# Patient Record
Sex: Female | Born: 1965 | Race: Black or African American | Hispanic: No | Smoking: Never smoker
Health system: Southern US, Community
[De-identification: ages and names within clinical notes are randomized; demographics above are authoritative.]

## PROBLEM LIST (undated history)

## (undated) DIAGNOSIS — E049 Nontoxic goiter, unspecified: Secondary | ICD-10-CM

## (undated) DIAGNOSIS — D352 Benign neoplasm of pituitary gland: Secondary | ICD-10-CM

## (undated) DIAGNOSIS — H9190 Unspecified hearing loss, unspecified ear: Secondary | ICD-10-CM

## (undated) DIAGNOSIS — D490 Neoplasm of unspecified behavior of digestive system: Secondary | ICD-10-CM

## (undated) DIAGNOSIS — C50919 Malignant neoplasm of unspecified site of unspecified female breast: Secondary | ICD-10-CM

## (undated) HISTORY — DX: Neoplasm of unspecified behavior of digestive system: D49.0

## (undated) HISTORY — DX: Benign neoplasm of pituitary gland: D35.2

## (undated) HISTORY — DX: Nontoxic goiter, unspecified: E04.9

## (undated) HISTORY — PX: MASTECTOMY: SHX3

## (undated) HISTORY — DX: Malignant neoplasm of unspecified site of unspecified female breast: C50.919

## (undated) HISTORY — DX: Unspecified hearing loss, unspecified ear: H91.90

---

## 2014-01-22 ENCOUNTER — Emergency Department (HOSPITAL_COMMUNITY)
Admission: EM | Admit: 2014-01-22 | Discharge: 2014-01-22 | Discharge status: Against Medical Advice | Payer: BC Managed Care – PPO | Attending: Emergency Medicine | Admitting: Emergency Medicine

## 2014-01-22 ENCOUNTER — Encounter (HOSPITAL_COMMUNITY): Payer: Self-pay | Admitting: Emergency Medicine

## 2014-01-22 DIAGNOSIS — R209 Unspecified disturbances of skin sensation: Secondary | ICD-10-CM | POA: Insufficient documentation

## 2014-01-22 DIAGNOSIS — R5381 Other malaise: Secondary | ICD-10-CM | POA: Insufficient documentation

## 2014-01-22 DIAGNOSIS — R5383 Other fatigue: Secondary | ICD-10-CM

## 2014-01-22 DIAGNOSIS — R51 Headache: Secondary | ICD-10-CM | POA: Insufficient documentation

## 2014-01-22 LAB — CBC
HCT: 34.4 % — ABNORMAL LOW (ref 36.0–46.0)
HEMOGLOBIN: 10.9 g/dL — AB (ref 12.0–15.0)
MCH: 25.1 pg — AB (ref 26.0–34.0)
MCHC: 31.7 g/dL (ref 30.0–36.0)
MCV: 79.3 fL (ref 78.0–100.0)
Platelets: 244 10*3/uL (ref 150–400)
RBC: 4.34 MIL/uL (ref 3.87–5.11)
RDW: 18.1 % — ABNORMAL HIGH (ref 11.5–15.5)
WBC: 5.4 10*3/uL (ref 4.0–10.5)

## 2014-01-22 LAB — BASIC METABOLIC PANEL
BUN: 9 mg/dL (ref 6–23)
CALCIUM: 9.6 mg/dL (ref 8.4–10.5)
CO2: 24 meq/L (ref 19–32)
CREATININE: 0.77 mg/dL (ref 0.50–1.10)
Chloride: 100 mEq/L (ref 96–112)
GFR calc Af Amer: >90 mL/min (ref >90)
GFR calc non Af Amer: >90 mL/min (ref >90)
Glucose, Bld: 102 mg/dL — ABNORMAL HIGH (ref 70–99)
Potassium: 3.8 mEq/L (ref 3.7–5.3)
SODIUM: 140 meq/L (ref 137–147)

## 2014-01-22 LAB — I-STAT TROPONIN, ED: Troponin i, poc: 0 ng/mL (ref 0.00–0.08)

## 2014-01-22 NOTE — ED Notes (Signed)
Patient here with multiple complaints. States that she has been feeling fatigued, nausea, head pain, and lips tingling and numb. Also states that once last week she had difficulty opening right eye, and again tonight felt the same. History of migraine as a child, but not recently.

## 2014-06-04 ENCOUNTER — Other Ambulatory Visit: Payer: Self-pay | Admitting: Otolaryngology

## 2014-06-04 DIAGNOSIS — H9041 Sensorineural hearing loss, unilateral, right ear, with unrestricted hearing on the contralateral side: Secondary | ICD-10-CM

## 2014-06-07 ENCOUNTER — Ambulatory Visit
Admission: RE | Admit: 2014-06-07 | Discharge: 2014-06-07 | Disposition: A | Discharge status: Home / Self Care | Payer: BC Managed Care – PPO | Source: Ambulatory Visit | Attending: Otolaryngology | Admitting: Otolaryngology

## 2014-06-07 ENCOUNTER — Encounter (INDEPENDENT_AMBULATORY_CARE_PROVIDER_SITE_OTHER): Payer: Self-pay

## 2014-06-07 DIAGNOSIS — H9041 Sensorineural hearing loss, unilateral, right ear, with unrestricted hearing on the contralateral side: Secondary | ICD-10-CM

## 2014-06-07 MED ORDER — IOHEXOL 300 MG/ML  SOLN
75.0000 mL | Freq: Once | INTRAMUSCULAR | Status: AC | PRN
  Administered 2014-06-07: 75 mL via INTRAVENOUS

## 2014-10-08 ENCOUNTER — Ambulatory Visit: Payer: Self-pay | Admitting: Family Medicine

## 2015-01-01 LAB — HM PAP SMEAR: HM Pap smear: NORMAL

## 2015-01-03 ENCOUNTER — Other Ambulatory Visit: Payer: Self-pay | Admitting: Obstetrics & Gynecology

## 2015-01-03 DIAGNOSIS — Z1231 Encounter for screening mammogram for malignant neoplasm of breast: Secondary | ICD-10-CM

## 2015-01-03 DIAGNOSIS — Z9889 Other specified postprocedural states: Secondary | ICD-10-CM

## 2015-01-03 DIAGNOSIS — Z9011 Acquired absence of right breast and nipple: Secondary | ICD-10-CM

## 2015-01-22 ENCOUNTER — Ambulatory Visit (INDEPENDENT_AMBULATORY_CARE_PROVIDER_SITE_OTHER): Payer: Self-pay | Admitting: Family Medicine

## 2015-01-22 ENCOUNTER — Encounter: Payer: Self-pay | Admitting: Family Medicine

## 2015-01-22 VITALS — BP 130/78 | HR 79 | Temp 98.5°F | Ht 68.0 in | Wt 192.0 lb

## 2015-01-22 DIAGNOSIS — Z9889 Other specified postprocedural states: Secondary | ICD-10-CM

## 2015-01-22 DIAGNOSIS — D352 Benign neoplasm of pituitary gland: Secondary | ICD-10-CM

## 2015-01-22 DIAGNOSIS — L989 Disorder of the skin and subcutaneous tissue, unspecified: Secondary | ICD-10-CM

## 2015-01-22 DIAGNOSIS — E049 Nontoxic goiter, unspecified: Secondary | ICD-10-CM

## 2015-01-22 DIAGNOSIS — Z9049 Acquired absence of other specified parts of digestive tract: Secondary | ICD-10-CM

## 2015-01-22 DIAGNOSIS — Z853 Personal history of malignant neoplasm of breast: Secondary | ICD-10-CM

## 2015-01-22 DIAGNOSIS — H9191 Unspecified hearing loss, right ear: Secondary | ICD-10-CM

## 2015-01-22 NOTE — Patient Instructions (Signed)
I'll request your old records and your recent labs.  We'll go from there.  Take care.  Glad to see you.

## 2015-01-22 NOTE — Progress Notes (Signed)
Pre visit review using our clinic review tool, if applicable. No additional management support is needed unless otherwise documented below in the visit note.  New patient. Needs to est care.    H/o B thyroid enlargement s/p neg bx prev.  Not on treatment currently.  No new changes per patient.   H/o pituitary microadenoma per patient, had f/u imaging prev and was dec in size per patient report.  No neuro sx, no tunnel vision.   H/o breast cancer, s/p mastectomy, chemo and radiation.    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     PMH and SH reviewed  ROS: See HPI, otherwise noncontributory.  Meds, vitals, and allergies reviewed.   GEN: nad, alert and oriented HEENT: mucous membranes moist, TM wnl, chronic post surgical changes noted on the R side of the neck and face NECK: supple w/o LA, B thyroid enlargement noted but not ttp CV: rrr.  no murmur PULM: ctab, no inc wob ABD: soft, +bs EXT: no edema SKIN: no acute rash but 31mm hyperpigmented macule on the R wrist, present for about 2 weeks per patient.

## 2015-01-25 ENCOUNTER — Encounter: Payer: Self-pay | Admitting: Family Medicine

## 2015-01-27 ENCOUNTER — Encounter: Payer: Self-pay | Admitting: Family Medicine

## 2015-01-27 DIAGNOSIS — Z853 Personal history of malignant neoplasm of breast: Secondary | ICD-10-CM | POA: Insufficient documentation

## 2015-01-27 DIAGNOSIS — E049 Nontoxic goiter, unspecified: Secondary | ICD-10-CM | POA: Insufficient documentation

## 2015-01-27 DIAGNOSIS — Z9049 Acquired absence of other specified parts of digestive tract: Secondary | ICD-10-CM | POA: Insufficient documentation

## 2015-01-27 DIAGNOSIS — H9191 Unspecified hearing loss, right ear: Secondary | ICD-10-CM | POA: Insufficient documentation

## 2015-01-27 DIAGNOSIS — D352 Benign neoplasm of pituitary gland: Secondary | ICD-10-CM | POA: Insufficient documentation

## 2015-01-27 DIAGNOSIS — L989 Disorder of the skin and subcutaneous tissue, unspecified: Secondary | ICD-10-CM | POA: Insufficient documentation

## 2015-01-27 NOTE — Assessment & Plan Note (Signed)
Per ENT

## 2015-01-27 NOTE — Assessment & Plan Note (Signed)
Requesting records from other MDs.

## 2015-01-27 NOTE — Assessment & Plan Note (Addendum)
H/o B thyroid enlargement s/p neg bx prev.  Not on treatment currently.  No new changes per patient.  Requesting records.  >30 minutes spent in face to face time with patient, >50% spent in counselling or coordination of care

## 2015-01-27 NOTE — Assessment & Plan Note (Signed)
From prev parotid treatment.  Per ENT.

## 2015-01-27 NOTE — Assessment & Plan Note (Signed)
Appearing benign, would only observe for now.  D/w pt.  She agrees.

## 2015-01-31 ENCOUNTER — Ambulatory Visit
Admission: RE | Admit: 2015-01-31 | Discharge: 2015-01-31 | Disposition: A | Discharge status: Home / Self Care | Payer: BLUE CROSS/BLUE SHIELD | Source: Ambulatory Visit | Attending: Obstetrics & Gynecology | Admitting: Obstetrics & Gynecology

## 2015-01-31 DIAGNOSIS — Z1231 Encounter for screening mammogram for malignant neoplasm of breast: Secondary | ICD-10-CM

## 2015-01-31 DIAGNOSIS — Z9889 Other specified postprocedural states: Secondary | ICD-10-CM

## 2015-01-31 DIAGNOSIS — Z9011 Acquired absence of right breast and nipple: Secondary | ICD-10-CM

## 2015-02-01 ENCOUNTER — Other Ambulatory Visit: Payer: Self-pay | Admitting: Obstetrics & Gynecology

## 2015-02-01 DIAGNOSIS — N644 Mastodynia: Secondary | ICD-10-CM

## 2015-02-06 ENCOUNTER — Ambulatory Visit
Admission: RE | Admit: 2015-02-06 | Discharge: 2015-02-06 | Disposition: A | Discharge status: Home / Self Care | Payer: BLUE CROSS/BLUE SHIELD | Source: Ambulatory Visit | Attending: Obstetrics & Gynecology | Admitting: Obstetrics & Gynecology

## 2015-02-06 DIAGNOSIS — N644 Mastodynia: Secondary | ICD-10-CM

## 2015-02-14 ENCOUNTER — Encounter: Payer: Self-pay | Admitting: Family Medicine

## 2015-02-14 LAB — GLUCOSE (CC13)
ALT: 21 U/L (ref 7–35)
AST: 19 U/L
Cholesterol, Total: 219
Creatinine, Ser: 0.77
GLUCOSE: 92
HDL: 60 mg/dL (ref 35–70)
LDL (calc): 146
TSH: 1.241
Triglycerides: 63
Vit D, 25-Hydroxy: 16

## 2015-02-24 ENCOUNTER — Telehealth: Payer: Self-pay | Admitting: Family Medicine

## 2015-02-24 NOTE — Telephone Encounter (Signed)
Noted. Thanks.

## 2015-02-24 NOTE — Telephone Encounter (Signed)
-----   Message from Josetta Huddle, [place] sent at 02/22/2015  5:44 PM EDT ----- Patient is taking OTC Vitamin D and Tdap is up to date.  Patient has an immunization card indicating the date given. ----- Message -----    From: Josetta Huddle, CMA    Sent: 02/14/2015   7:04 PM      To: Josetta Huddle, CMA  Outside labs received and entered in EPIC.  Dr Damita Dunnings asks if patient has been treated for low Vit D level, (ie OTC Vit D) and if Tdap is up to date.  LMOVM for patient to call in with this information.

## 2015-08-26 ENCOUNTER — Other Ambulatory Visit: Payer: Self-pay | Admitting: Family Medicine

## 2015-08-26 DIAGNOSIS — E041 Nontoxic single thyroid nodule: Secondary | ICD-10-CM

## 2015-09-02 ENCOUNTER — Ambulatory Visit
Admission: RE | Admit: 2015-09-02 | Discharge: 2015-09-02 | Disposition: A | Discharge status: Home / Self Care | Payer: BLUE CROSS/BLUE SHIELD | Source: Ambulatory Visit | Attending: Family Medicine | Admitting: Family Medicine

## 2015-09-02 DIAGNOSIS — E041 Nontoxic single thyroid nodule: Secondary | ICD-10-CM

## 2015-09-11 ENCOUNTER — Other Ambulatory Visit: Payer: Self-pay | Admitting: Family Medicine

## 2015-09-11 DIAGNOSIS — E041 Nontoxic single thyroid nodule: Secondary | ICD-10-CM

## 2015-09-18 ENCOUNTER — Ambulatory Visit
Admission: RE | Admit: 2015-09-18 | Discharge: 2015-09-18 | Disposition: A | Discharge status: Home / Self Care | Payer: BLUE CROSS/BLUE SHIELD | Source: Ambulatory Visit | Attending: Family Medicine | Admitting: Family Medicine

## 2015-09-18 ENCOUNTER — Other Ambulatory Visit (HOSPITAL_COMMUNITY)
Admission: RE | Admit: 2015-09-18 | Discharge: 2015-09-18 | Disposition: A | Discharge status: Home / Self Care | Payer: BLUE CROSS/BLUE SHIELD | Source: Ambulatory Visit | Attending: Diagnostic Radiology | Admitting: Diagnostic Radiology

## 2015-09-18 DIAGNOSIS — E041 Nontoxic single thyroid nodule: Secondary | ICD-10-CM

## 2015-11-16 ENCOUNTER — Emergency Department (HOSPITAL_COMMUNITY): Payer: BLUE CROSS/BLUE SHIELD

## 2015-11-16 ENCOUNTER — Emergency Department (HOSPITAL_COMMUNITY)
Admission: EM | Admit: 2015-11-16 | Discharge: 2015-11-17 | Disposition: A | Discharge status: Home / Self Care | Payer: BLUE CROSS/BLUE SHIELD | Attending: Emergency Medicine | Admitting: Emergency Medicine

## 2015-11-16 ENCOUNTER — Encounter (HOSPITAL_COMMUNITY): Payer: Self-pay | Admitting: *Deleted

## 2015-11-16 DIAGNOSIS — R200 Anesthesia of skin: Secondary | ICD-10-CM | POA: Diagnosis not present

## 2015-11-16 DIAGNOSIS — H9190 Unspecified hearing loss, unspecified ear: Secondary | ICD-10-CM | POA: Diagnosis not present

## 2015-11-16 DIAGNOSIS — M25551 Pain in right hip: Secondary | ICD-10-CM | POA: Insufficient documentation

## 2015-11-16 DIAGNOSIS — Z853 Personal history of malignant neoplasm of breast: Secondary | ICD-10-CM | POA: Insufficient documentation

## 2015-11-16 DIAGNOSIS — Z86018 Personal history of other benign neoplasm: Secondary | ICD-10-CM | POA: Diagnosis not present

## 2015-11-16 DIAGNOSIS — R000 Tachycardia, unspecified: Secondary | ICD-10-CM | POA: Diagnosis not present

## 2015-11-16 DIAGNOSIS — Z79899 Other long term (current) drug therapy: Secondary | ICD-10-CM | POA: Insufficient documentation

## 2015-11-16 DIAGNOSIS — J111 Influenza due to unidentified influenza virus with other respiratory manifestations: Secondary | ICD-10-CM | POA: Insufficient documentation

## 2015-11-16 DIAGNOSIS — M79606 Pain in leg, unspecified: Secondary | ICD-10-CM

## 2015-11-16 DIAGNOSIS — Z3202 Encounter for pregnancy test, result negative: Secondary | ICD-10-CM | POA: Insufficient documentation

## 2015-11-16 DIAGNOSIS — R69 Illness, unspecified: Secondary | ICD-10-CM

## 2015-11-16 DIAGNOSIS — M79651 Pain in right thigh: Secondary | ICD-10-CM | POA: Insufficient documentation

## 2015-11-16 DIAGNOSIS — R05 Cough: Secondary | ICD-10-CM | POA: Diagnosis present

## 2015-11-16 DIAGNOSIS — M79622 Pain in left upper arm: Secondary | ICD-10-CM | POA: Insufficient documentation

## 2015-11-16 DIAGNOSIS — Z8639 Personal history of other endocrine, nutritional and metabolic disease: Secondary | ICD-10-CM | POA: Insufficient documentation

## 2015-11-16 LAB — URINALYSIS, ROUTINE W REFLEX MICROSCOPIC
BILIRUBIN URINE: NEGATIVE
GLUCOSE, UA: NEGATIVE mg/dL
Hgb urine dipstick: NEGATIVE
KETONES UR: NEGATIVE mg/dL
LEUKOCYTES UA: NEGATIVE
NITRITE: NEGATIVE
PROTEIN: 30 mg/dL — AB
Specific Gravity, Urine: 1.021 (ref 1.005–1.030)
pH: 6 (ref 5.0–8.0)

## 2015-11-16 LAB — URINE MICROSCOPIC-ADD ON

## 2015-11-16 LAB — COMPREHENSIVE METABOLIC PANEL
ALBUMIN: 4.1 g/dL (ref 3.5–5.0)
ALT: 62 U/L — ABNORMAL HIGH (ref 14–54)
ANION GAP: 10 (ref 5–15)
AST: 46 U/L — AB (ref 15–41)
Alkaline Phosphatase: 108 U/L (ref 38–126)
BUN: 7 mg/dL (ref 6–20)
CHLORIDE: 102 mmol/L (ref 101–111)
CO2: 23 mmol/L (ref 22–32)
Calcium: 9.3 mg/dL (ref 8.9–10.3)
Creatinine, Ser: 0.91 mg/dL (ref 0.44–1.00)
GFR calc Af Amer: >60 mL/min (ref >60)
GLUCOSE: 111 mg/dL — AB (ref 65–99)
POTASSIUM: 3.5 mmol/L (ref 3.5–5.1)
Sodium: 135 mmol/L (ref 135–145)
TOTAL PROTEIN: 8.9 g/dL — AB (ref 6.5–8.1)
Total Bilirubin: 0.9 mg/dL (ref 0.3–1.2)

## 2015-11-16 LAB — I-STAT CG4 LACTIC ACID, ED
LACTIC ACID, VENOUS: 1.45 mmol/L (ref 0.5–2.0)
Lactic Acid, Venous: 0.69 mmol/L (ref 0.5–2.0)

## 2015-11-16 LAB — I-STAT BETA HCG BLOOD, ED (MC, WL, AP ONLY): I-stat hCG, quantitative: <5 m[IU]/mL (ref <5)

## 2015-11-16 LAB — CBC WITH DIFFERENTIAL/PLATELET
BASOS ABS: 0 10*3/uL (ref 0.0–0.1)
BASOS PCT: 0 %
EOS PCT: 1 %
Eosinophils Absolute: 0 10*3/uL (ref 0.0–0.7)
HCT: 37.6 % (ref 36.0–46.0)
Hemoglobin: 12.1 g/dL (ref 12.0–15.0)
Lymphocytes Relative: 11 %
Lymphs Abs: 0.6 10*3/uL — ABNORMAL LOW (ref 0.7–4.0)
MCH: 27.5 pg (ref 26.0–34.0)
MCHC: 32.2 g/dL (ref 30.0–36.0)
MCV: 85.5 fL (ref 78.0–100.0)
MONO ABS: 0.4 10*3/uL (ref 0.1–1.0)
Monocytes Relative: 7 %
Neutro Abs: 4.3 10*3/uL (ref 1.7–7.7)
Neutrophils Relative %: 81 %
PLATELETS: 180 10*3/uL (ref 150–400)
RBC: 4.4 MIL/uL (ref 3.87–5.11)
RDW: 16.8 % — AB (ref 11.5–15.5)
WBC: 5.3 10*3/uL (ref 4.0–10.5)

## 2015-11-16 MED ORDER — SODIUM CHLORIDE 0.9 % IV BOLUS (SEPSIS)
1000.0000 mL | Freq: Once | INTRAVENOUS | Status: AC
  Administered 2015-11-16: 1000 mL via INTRAVENOUS

## 2015-11-16 MED ORDER — KETOROLAC TROMETHAMINE 15 MG/ML IJ SOLN
15.0000 mg | Freq: Once | INTRAMUSCULAR | Status: AC
  Administered 2015-11-16: 15 mg via INTRAVENOUS
  Filled 2015-11-16: qty 1

## 2015-11-16 MED ORDER — ACETAMINOPHEN 325 MG PO TABS
ORAL_TABLET | ORAL | Status: DC
Start: 2015-11-16 — End: 2015-11-17
  Filled 2015-11-16: qty 2

## 2015-11-16 MED ORDER — ACETAMINOPHEN 325 MG PO TABS
650.0000 mg | ORAL_TABLET | Freq: Four times a day (QID) | ORAL | Status: DC | PRN
  Administered 2015-11-16: 650 mg via ORAL

## 2015-11-16 NOTE — ED Provider Notes (Signed)
CSN: [id number]     Arrival date & time 11/16/15  1951 History   First MD Initiated Contact with Patient 11/16/15 2142     Chief Complaint  Patient presents with  . Cough  . Fever  . Right leg pain   . Bottom Lip Numbness      (Consider location/radiation/quality/duration/timing/severity/associated sxs/prior Treatment) Patient is a 50 y.o. female presenting with cough, fever, and leg pain. The history is provided by the patient.  Cough Cough characteristics:  Non-productive Severity:  Moderate Onset quality:  Gradual Duration:  3 days Timing:  Intermittent Progression:  Waxing and waning Chronicity:  New Smoker: no   Context: sick contacts and upper respiratory infection   Context comment:  Recent travel to [place] Relieved by:  None tried Worsened by:  Activity Ineffective treatments:  None tried Associated symptoms: chills, fever, headaches, myalgias, rhinorrhea and sinus congestion   Associated symptoms: no chest pain, no rash, no shortness of breath, no sore throat and no wheezing   Fever:    Duration:  3 days   Timing:  Intermittent   Temp source:  Subjective   Progression:  Waxing and waning Headaches:    Severity:  Moderate   Onset quality:  Gradual   Duration:  3 days   Timing:  Intermittent   Progression:  Waxing and waning   Chronicity:  New Myalgias:    Location: right upper leg, left arm.   Quality:  Aching   Severity:  Moderate   Onset quality:  Gradual   Duration:  3 days   Progression:  Waxing and waning Rhinorrhea:    Quality:  Clear   Severity:  Moderate   Duration:  3 days   Progression:  Waxing and waning Risk factors: recent travel   Fever Associated symptoms: chills, congestion, cough, headaches, myalgias and rhinorrhea   Associated symptoms: no chest pain, no diarrhea, no dysuria, no nausea, no rash, no sore throat and no vomiting   Leg Pain Location:  Leg Time since incident:  3 weeks Injury: no   Leg location:  R leg and R upper  leg Pain details:    Quality:  Aching   Radiates to:  R leg   Severity:  Moderate   Onset quality:  Gradual   Duration:  3 weeks   Timing:  Constant   Progression:  Worsening (over the last 3 days ) Chronicity:  New Dislocation: no   Foreign body present:  No foreign bodies Tetanus status:  Unknown Prior injury to area:  No Relieved by:  None tried Worsened by:  Bearing weight and activity Ineffective treatments:  None tried Associated symptoms: fever   Associated symptoms: no back pain, no decreased ROM, no muscle weakness, no neck pain, no numbness, no stiffness, no swelling and no tingling   Fever:    Duration:  3 days   Timing:  Intermittent   Temp source:  Subjective Risk factors: no concern for non-accidental trauma, no known bone disorder and no obesity   Risk factors comment:  Hx of remote breast cancer   Past Medical History  Diagnosis Date  . Breast cancer ([place]) ~2008    Breast, Right, s/p mastectomy, chemo, radiation   . Hearing loss     R ear, afgter radiation treatment for parotid tumor  . 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 Thyroid enlarged     s/p neg biopsy.    . Pituitary microadenoma ([place])  with prev dec in size on f/u imaging per patient report   Past Surgical History  Procedure Laterality Date  . Mastectomy      Right  . Cesarean section     No family history on file. Social History  Substance Use Topics  . Smoking status: Never Smoker   . Smokeless tobacco: Never Used  . Alcohol Use: Yes     Comment: rare   OB History    No data available     Review of Systems  Constitutional: Positive for fever, chills and activity change.  HENT: Positive for congestion, rhinorrhea, sinus pressure and sneezing. Negative for sore throat.   Respiratory: Positive for cough. Negative for shortness of breath and wheezing.   Cardiovascular: Negative for chest pain.  Gastrointestinal: Negative for nausea, vomiting, abdominal  pain, diarrhea and constipation.  Genitourinary: Negative for dysuria, urgency, flank pain and difficulty urinating.  Musculoskeletal: Positive for myalgias and gait problem (right leg pain). Negative for back pain, stiffness and neck pain.  Skin: Negative for rash and wound.  Neurological: Positive for headaches. Negative for dizziness, seizures, syncope and weakness.  All other systems reviewed and are negative.     Allergies  Review of patient's allergies indicates no known allergies.  Home Medications   Prior to Admission medications   Medication Sig Start Date End Date Taking? Authorizing Provider  hydrochlorothiazide (HYDRODIURIL) 25 MG tablet Take 25 mg by mouth daily. 10/30/15  Yes Historical Provider, MD   BP 150/94 mmHg  Pulse 95  Temp(Src) 100.7 F (38.2 C) (Oral)  Resp 21  Ht 5\' 8"  (1.727 m)  Wt 81.647 kg  BMI 27.38 kg/m2  SpO2 99%  LMP 10/22/2013 Physical Exam  Constitutional: She is oriented to person, place, and time. She appears well-developed and well-nourished. No distress.  HENT:  Head: Normocephalic and atraumatic.  Mouth/Throat: Oropharynx is clear and moist.  Oropharynx slightly dry appearing, concern for dehydration  Eyes: Conjunctivae and EOM are normal. Pupils are equal, round, and reactive to light.  Neck: Normal range of motion. Neck supple.  Cardiovascular: Regular rhythm, normal heart sounds and intact distal pulses.  Tachycardia present.   Pulmonary/Chest: Effort normal and breath sounds normal. She exhibits tenderness.  Abdominal: Soft. She exhibits no distension. There is no tenderness.  Musculoskeletal: Normal range of motion. She exhibits tenderness. She exhibits no edema.       Left upper arm: She exhibits tenderness. She exhibits no bony tenderness, no swelling, no deformity and no laceration.       Arms:      Right upper leg: She exhibits tenderness. She exhibits no bony tenderness, no swelling, no edema, no deformity and no laceration.        Legs: FROM of her right hip, with no bony tenderness, crepitus, or deformity. Neg grind. No pain with rotation of LE. NVI  Neurological: She is alert and oriented to person, place, and time. No cranial nerve deficit. Coordination normal.  Skin: Skin is warm and dry. No rash noted. She is not diaphoretic.  Nursing note and vitals reviewed.   ED Course  Procedures (including critical care time) Labs Review Labs Reviewed  COMPREHENSIVE METABOLIC PANEL - Abnormal; Notable for the following:    Glucose, Bld 111 (*)    Total Protein 8.9 (*)    AST 46 (*)    ALT 62 (*)    All other components within normal limits  CBC WITH DIFFERENTIAL/PLATELET - Abnormal; Notable for the following:  RDW 16.8 (*)    Lymphs Abs 0.6 (*)    All other components within normal limits  URINALYSIS, ROUTINE W REFLEX MICROSCOPIC (NOT AT [facility]) - Abnormal; Notable for the following:    Protein, ur 30 (*)    All other components within normal limits  URINE MICROSCOPIC-ADD ON - Abnormal; Notable for the following:    Squamous Epithelial / LPF 0-5 (*)    Bacteria, UA RARE (*)    All other components within normal limits  CULTURE, BLOOD (ROUTINE X 2)  CULTURE, BLOOD (ROUTINE X 2)  URINE CULTURE  I-STAT BETA HCG BLOOD, ED ([facility], [facility], [facility] ONLY)  I-STAT CG4 LACTIC ACID, ED  I-STAT CG4 LACTIC ACID, ED    Imaging Review Dg Chest 2 View  11/16/2015  CLINICAL DATA:  Cough and fever.  Former smoker. EXAM: CHEST  2 VIEW COMPARISON:  None. FINDINGS: The heart size and mediastinal contours are within normal limits. Both lungs are clear. The visualized skeletal structures are unremarkable. IMPRESSION: No active cardiopulmonary disease. Electronically Signed   By: Kerby Moors M.D.   On: 11/16/2015 21:10   Dg Pelvis 1-2 Views  11/16/2015  CLINICAL DATA:  Pain in her right hip which extends into right tib fib. EXAM: PELVIS - 1-2 VIEW COMPARISON:  None FINDINGS: There is no evidence of pelvic fracture or diastasis. No  pelvic bone lesions are seen. IMPRESSION: Negative. Electronically Signed   By: Kerby Moors M.D.   On: 11/16/2015 23:59   Dg Tibia/fibula Right  11/17/2015  CLINICAL DATA:  Right lower extremity pain and swelling. Pain from the hip down to the lower leg. EXAM: RIGHT TIBIA AND FIBULA - 2 VIEW COMPARISON:  None. FINDINGS: There is no evidence of fracture or other focal bone lesions. Knee and ankle alignment is maintained. Soft tissues are unremarkable. IMPRESSION: Negative radiographs of the right lower leg. Electronically Signed   By: Jeb Levering M.D.   On: 11/17/2015 00:06   Dg Femur, Min 2 Views Right  11/17/2015  CLINICAL DATA:  Right lower extremity pain and swelling. Pain from the hip to the lower leg. EXAM: RIGHT FEMUR 2 VIEWS COMPARISON:  None. FINDINGS: There is no evidence of fracture or other focal bone lesions. Hip and knee alignment is maintained. There nutrient channel within the proximal femoral diaphysis. Soft tissues are unremarkable. IMPRESSION: Negative radiographs of the right femur. Electronically Signed   By: Jeb Levering M.D.   On: 11/17/2015 00:07   I have personally reviewed and evaluated these images and lab results as part of my medical decision-making.   EKG Interpretation   Date/Time:  Saturday November 16 2015 21:47:53 EDT Ventricular Rate:  107 PR Interval:  164 QRS Duration: 78 QT Interval:  367 QTC Calculation: 490 R Axis:     Text Interpretation:  Sinus tachycardia Probable left atrial enlargement  Consider anterior infarct Borderline T abnormalities, inferior leads  Confirmed by [facility] MD, Corene Cornea [phone]) on 11/16/2015 11:50:58 PM      MDM  50 y.o. female with a remote history of breast cancer, status post mastectomy, chemotherapy, radiation, with recent travel to [place] by air with likely multiple sick contacts presents to the emergency department noting a three-day history of influenza-like illness symptoms. Denies getting a flu shot this year.  Additionally the patient notes a three-week history of persistent worsening right thigh pain worse with bearing weight and ambulation. She denies any known history of bony metastases. History of prior PE or DVT. She notes achy  muscle pain that is now worse in her right lower leg now affecting other major muscle groups in her arms. Physical exam as above. She was initially febrile to 100.7 and tachycardic to 117 with a stable blood pressure and normal oxygen saturation. Labs are drawn and showed no evidence of lactic acidosis, leukocytosis, no any other significant laboratory abnormalities. Given her long history of regular shoulder pain with remote history of breast cancer concern raised for possible malignant etiology of lower extremity pain. Xrays were done and showed no evidence of bony metastases or other significant abnormalities. CXR showed no acute abnormality, no PNA. Feel that she has an influenza-like illness precipitating her cough, fever, and myalgias. Concern raised for this now several week hx of RLE pain, plan to get RLE [place] to further evaluate tomorrow. She was recommended to get plenty of rest, fluids, and take NSAIDs for further symptomatic relief. She was recommended to f/u closely with her PCP to discuss her results of her [place] and to monitor for improvement in her sx.  Return precautions were given. This plan was discussed with the patient at the bedside and she stated both understanding and agreement.      Final diagnoses:  Right hip pain  Right thigh pain  Influenza-like illness      Zenovia Jarred, DO 11/17/15 0107  Merrily Pew, MD 11/17/15 [phone]

## 2015-11-16 NOTE — ED Notes (Signed)
Patient presents with c/o pain to the inner right leg, cough, fever and numbness to the bottom lip for 3 hours

## 2015-11-18 LAB — URINE CULTURE

## 2015-11-19 ENCOUNTER — Ambulatory Visit (HOSPITAL_COMMUNITY)
Admission: EM | Admit: 2015-11-19 | Discharge: 2015-11-19 | Disposition: A | Discharge status: Home / Self Care | Payer: BLUE CROSS/BLUE SHIELD | Attending: Emergency Medicine | Admitting: Emergency Medicine

## 2015-11-19 ENCOUNTER — Emergency Department (HOSPITAL_COMMUNITY)
Admission: EM | Admit: 2015-11-19 | Discharge: 2015-11-20 | Discharge status: Absent without leave | Payer: BLUE CROSS/BLUE SHIELD | Source: Home / Self Care | Attending: Emergency Medicine | Admitting: Emergency Medicine

## 2015-11-19 ENCOUNTER — Inpatient Hospital Stay (HOSPITAL_COMMUNITY): Admit: 2015-11-19 | Payer: BLUE CROSS/BLUE SHIELD

## 2015-11-19 DIAGNOSIS — M7989 Other specified soft tissue disorders: Secondary | ICD-10-CM | POA: Insufficient documentation

## 2015-11-19 DIAGNOSIS — M79609 Pain in unspecified limb: Secondary | ICD-10-CM

## 2015-11-19 DIAGNOSIS — M79604 Pain in right leg: Secondary | ICD-10-CM

## 2015-11-19 NOTE — ED Notes (Signed)
Spoke with patient and ED Doctor. Ordered placed when patient was seen 2 days ago for outpatient vascular ultra sound and does not need to be seen in ED. Called and spoke with Vascular who stated Vascular will perform test in ED room since order was for 1300 and cannot be performed in vascular until then. ED Doctor and Charge Nurse notified. Patient was not to be a ED patient and ED Doctor will not see patient to occur charge.

## 2015-11-19 NOTE — ED Notes (Signed)
Patient stated will come back at 1300 to [facility] V[facility].

## 2015-11-19 NOTE — ED Notes (Addendum)
Pt c/o R upper leg pain onset x 4 days ago, pt reports symptom onset with flu like symptoms, pt seen at our facility over the weekend & advised to come back here for a [place] d/t vascular not being available at the time, +PMS, pt denies redness & swelling to the area, pt ambulatory

## 2015-11-19 NOTE — ED Notes (Signed)
Spoke with registration who will remove patient from EPIC this encounter should not have been entered.

## 2015-11-19 NOTE — ED Provider Notes (Signed)
Patient seen recently and ordered outpatient duplex. However, the order was incorrect. I put in another order. Patient doesn't want to be seen again and have another ED visit. Will go to vascular US at 1pm today.   Wandra Arthurs, MD 11/19/15 1045

## 2015-11-19 NOTE — Progress Notes (Signed)
*  PRELIMINARY RESULTS* Vascular Ultrasound Left lower extremity venous duplex has been completed.  Preliminary findings: No evidence of DVT or baker's cyst.  Landry Mellow, RDMS, RVT  11/19/2015, 1:46 PM

## 2015-11-21 ENCOUNTER — Telehealth (HOSPITAL_BASED_OUTPATIENT_CLINIC_OR_DEPARTMENT_OTHER): Payer: Self-pay | Admitting: Emergency Medicine

## 2015-11-21 LAB — CULTURE, BLOOD (ROUTINE X 2): CULTURE: NO GROWTH

## 2015-11-21 NOTE — Telephone Encounter (Signed)
Per TJenetta Downer Ward PA, on 11/20/15  Patient needs to return to ED for reeval for + blood culture

## 2015-11-22 LAB — CULTURE, BLOOD (ROUTINE X 2)

## 2015-11-29 ENCOUNTER — Emergency Department (HOSPITAL_COMMUNITY)
Admission: EM | Admit: 2015-11-29 | Discharge: 2015-11-29 | Disposition: A | Discharge status: Home / Self Care | Payer: BLUE CROSS/BLUE SHIELD | Attending: Emergency Medicine | Admitting: Emergency Medicine

## 2015-11-29 ENCOUNTER — Encounter (HOSPITAL_COMMUNITY): Payer: Self-pay | Admitting: *Deleted

## 2015-11-29 ENCOUNTER — Telehealth: Payer: Self-pay

## 2015-11-29 DIAGNOSIS — Z853 Personal history of malignant neoplasm of breast: Secondary | ICD-10-CM | POA: Diagnosis not present

## 2015-11-29 DIAGNOSIS — H9190 Unspecified hearing loss, unspecified ear: Secondary | ICD-10-CM | POA: Diagnosis not present

## 2015-11-29 DIAGNOSIS — R05 Cough: Secondary | ICD-10-CM | POA: Diagnosis not present

## 2015-11-29 DIAGNOSIS — Z09 Encounter for follow-up examination after completed treatment for conditions other than malignant neoplasm: Secondary | ICD-10-CM | POA: Diagnosis present

## 2015-11-29 DIAGNOSIS — Z79899 Other long term (current) drug therapy: Secondary | ICD-10-CM | POA: Insufficient documentation

## 2015-11-29 DIAGNOSIS — R7881 Bacteremia: Secondary | ICD-10-CM | POA: Diagnosis not present

## 2015-11-29 NOTE — ED Notes (Signed)
PT states cultures were drawn on 4/8.  States she received call that the results were abnormal and that she needs to come in.

## 2015-11-29 NOTE — ED Provider Notes (Signed)
CSN: [id number]     Arrival date & time 11/29/15  [id number] History   First MD Initiated Contact with Patient 11/29/15 [phone]     Chief Complaint  Patient presents with  . Follow-up    abnormal lab     (Consider location/radiation/quality/duration/timing/severity/associated sxs/prior Treatment) Patient is a 50 y.o. female presenting with general illness. The history is provided by the patient.  Illness Severity:  Severe Onset quality:  Gradual Duration:  2 weeks Timing:  Constant Progression:  Resolved Chronicity:  New Associated symptoms: cough   Associated symptoms: no chest pain, no congestion, no fever, no headaches, no myalgias, no nausea, no rhinorrhea, no shortness of breath, no vomiting and no wheezing     50 yo F With a chief complaint of a positive blood culture. Patient was seen about 2 weeks ago and was diagnosed with the flu. Blood culture came back positive but the patient doesn't have voicemail and so she never got the message that said she had a culture. She eventually received a letter in the mail and then decided to come in. The positive blood culture was read about 9 days ago. Patient does not have fevers chills still feels a bit under the weather for her. Having a very mild cough.  Past Medical History  Diagnosis Date  . Breast cancer ([place]) ~2008    Breast, Right, s/p mastectomy, chemo, radiation   . Hearing loss     R ear, afgter radiation treatment for parotid tumor  . 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 Thyroid enlarged     s/p neg biopsy.    . Pituitary microadenoma ([place])     with prev dec in size on f/u imaging per patient report   Past Surgical History  Procedure Laterality Date  . Mastectomy      Right  . Cesarean section     No family history on file. Social History  Substance Use Topics  . Smoking status: Never Smoker   . Smokeless tobacco: Never Used  . Alcohol Use: Yes     Comment: rare   OB History    No data  available     Review of Systems  Constitutional: Negative for fever and chills.  HENT: Negative for congestion and rhinorrhea.   Eyes: Negative for redness and visual disturbance.  Respiratory: Positive for cough. Negative for shortness of breath and wheezing.   Cardiovascular: Negative for chest pain and palpitations.  Gastrointestinal: Negative for nausea and vomiting.  Genitourinary: Negative for dysuria and urgency.  Musculoskeletal: Negative for myalgias and arthralgias.  Skin: Negative for pallor and wound.  Neurological: Negative for dizziness and headaches.      Allergies  Review of patient's allergies indicates no known allergies.  Home Medications   Prior to Admission medications   Medication Sig Start Date End Date Taking? Authorizing Provider  hydrochlorothiazide (HYDRODIURIL) 25 MG tablet Take 25 mg by mouth daily. 10/30/15  Yes Historical Provider, MD   BP 148/100 mmHg  Pulse 84  Temp(Src) 98 F (36.7 C) (Oral)  Resp 18  Ht 5\' 8"  (1.727 m)  Wt 185 lb (83.915 kg)  BMI 28.14 kg/m2  SpO2 99%  LMP 10/22/2013 Physical Exam  Constitutional: She is oriented to person, place, and time. She appears well-developed and well-nourished. No distress.  HENT:  Head: Normocephalic and atraumatic.  Eyes: EOM are normal. Pupils are equal, round, and reactive to light.  Neck: Normal range of motion. Neck  supple.  Cardiovascular: Normal rate, regular rhythm and intact distal pulses.  Exam reveals no gallop and no friction rub.   No murmur heard. Pulmonary/Chest: Effort normal. She has no wheezes. She has no rales.  Abdominal: Soft. She exhibits no distension. There is no tenderness. There is no rebound and no guarding.  Musculoskeletal: She exhibits no edema or tenderness.  Neurological: She is alert and oriented to person, place, and time.  Skin: Skin is warm and dry. She is not diaphoretic.  Psychiatric: She has a normal mood and affect. Her behavior is normal.  Nursing  note and vitals reviewed.   ED Course  Procedures (including critical care time) Labs Review Labs Reviewed  CULTURE, BLOOD (ROUTINE X 2)  CULTURE, BLOOD (ROUTINE X 2)    Imaging Review No results found. I have personally reviewed and evaluated these images and lab results as part of my medical decision-making.   EKG Interpretation None      MDM   Final diagnoses:  Positive blood culture    50 yo F With a chief complaints of a positive blood culture. The patient is not currently having any fevers or chills. Is feeling much better from when she came to the hospital about 11 days ago. Feel no need to start antibiotics at this time with that history. Patient is not immunosuppressed. Denies IV drug abuse. No noted murmurs on cardiac exam. Will repeat blood culture today to evaluate for clearance. Discharge home.  9:26 AM:  I have discussed the diagnosis/risks/treatment options with the patient and believe the pt to be eligible for discharge home to follow-up with PCP. We also discussed returning to the ED immediately if new or worsening sx occur. We discussed the sx which are most concerning (e.g., fever,nausea, vomiting,  inability to tolerate by mouth) that necessitate immediate return. Medications administered to the patient during their visit and any new prescriptions provided to the patient are listed below.  Medications given during this visit Medications - No data to display  New Prescriptions   No medications on file    The patient appears reasonably screen and/or stabilized for discharge and I doubt any other medical condition or other [facility] requiring further screening, evaluation, or treatment in the ED at this time prior to discharge.      Deno Etienne, DO 11/29/15 [phone]

## 2015-11-29 NOTE — Telephone Encounter (Signed)
Patietn sent letter 11/21/15 after several calls to home regarding + blood culture. Instructed to return to hospital as soon as possible for treatment.

## 2015-11-29 NOTE — Discharge Instructions (Signed)
Follow-up with your doctor. Return if you get a fever start feeling much worse. They will call you if the blood culture comes back positive. Bacteremia Bacteremia is the presence of bacteria in the blood. A small amount of bacteria may not cause any symptoms. Sometimes, the bacteria spread and cause infection in other parts of the body, such as the heart, joints, bones, or brain. Having a great amount of bacteria can cause a serious, sometimes life-threatening infection called sepsis. CAUSES This condition is caused by bacteria that get into the blood. Bacteria can enter the blood:  During a dental or medical procedure.  After you brush your teeth so hard that the gums bleed.  Through a scrape or cut on your skin. More severe types of bacteremia can be caused by:  A bacterial infection, such as pneumonia, that spreads to the blood.  Using a dirty needle. RISK FACTORS This condition is more likely to develop in:  Children and elderly adults.  People who have a long-lasting (chronic) disease or medical condition.  People who have an artificial joint or heart valve.  People who have heart valve disease.  People who have a tube, such as a catheter or IV tube, that has been inserted for a medical treatment.  People who have a weak body defense system (immune system).  People who use IV drugs. SYMPTOMS Usually, this condition does not cause symptoms when it is mild. When it is more serious, it may cause:  Fever.  Chills.  Racing heart.  Shortness of breath.  Dizziness.  Weakness.  Confusion.  Nausea or vomiting.  Diarrhea. Bacteremia that has spread to other parts of the body may cause symptoms in those areas. DIAGNOSIS This condition may be diagnosed with a physical exam and tests, such as:  A complete blood count (CBC). This test looks for signs of infection.  Blood cultures. These look for bacteria in your blood.  Tests of any IV tubes. These look for a  source of infection.  Urine tests.  Imaging tests, such as an X-ray, CT scan, MRI, or heart ultrasound. TREATMENT If the condition is mild, treatment is usually not needed. Usually, the body's immune system will remove the bacteria. If the condition is more serious, it may be treated with:  Antibiotic medicines through an IV tube. These may be given for about 2 weeks. At first, the antibiotic that is given may kill most types of blood bacteria. If your test results show that a certain kind of bacteria is causing problems, the antibiotic may be changed to kill only the bacteria that are causing problems.  Antibiotics taken by mouth.  Removing any catheter or IV tube that is a source of infection.  Blood pressure and breathing support, if needed.  Surgery to control the source or spread of infection, if needed. HOME CARE INSTRUCTIONS  Take over-the-counter and prescription medicines only as told by your health care provider.  If you were prescribed an antibiotic, take it as told by your health care provider. Do not stop taking the antibiotic even if you start to feel better.  Rest at home until your condition is under control.  Drink enough fluid to keep your urine clear or pale yellow.  Keep all follow-up visits as told by your health care provider. This is important. PREVENTION Take these actions to help prevent future episodes of bacteremia:  Get all vaccinations as recommended by your health care provider.  Clean and cover scrapes or cuts.  Bathe regularly.  Wash your hands often.  Before any dental or surgical procedure, ask your health care provider if you should take an antibiotic. SEEK MEDICAL CARE IF:  Your symptoms get worse.  You continue to have symptoms after treatment.  You develop new symptoms after treatment. SEEK IMMEDIATE MEDICAL CARE IF:  You have chest pain or trouble breathing.  You develop confusion, dizziness, or weakness.  You develop pale  skin.   This information is not intended to replace advice given to you by your health care provider. Make sure you discuss any questions you have with your health care provider.   Document Released: 05/10/2006 Document Revised: 04/17/2015 Document Reviewed: 09/29/2014 Elsevier Interactive Patient Education [employer].

## 2015-12-03 ENCOUNTER — Telehealth (HOSPITAL_BASED_OUTPATIENT_CLINIC_OR_DEPARTMENT_OTHER): Payer: Self-pay | Admitting: Emergency Medicine

## 2015-12-04 LAB — CULTURE, BLOOD (ROUTINE X 2)
CULTURE: NO GROWTH
Culture: NO GROWTH

## 2016-05-05 ENCOUNTER — Ambulatory Visit: Payer: BLUE CROSS/BLUE SHIELD | Admitting: Cardiovascular Disease

## 2016-09-23 ENCOUNTER — Other Ambulatory Visit: Payer: Self-pay | Admitting: Family Medicine

## 2016-09-23 DIAGNOSIS — E041 Nontoxic single thyroid nodule: Secondary | ICD-10-CM

## 2016-10-19 ENCOUNTER — Other Ambulatory Visit: Payer: BLUE CROSS/BLUE SHIELD

## 2016-10-23 ENCOUNTER — Ambulatory Visit
Admission: RE | Admit: 2016-10-23 | Discharge: 2016-10-23 | Disposition: A | Discharge status: Home / Self Care | Payer: BLUE CROSS/BLUE SHIELD | Source: Ambulatory Visit | Attending: Family Medicine | Admitting: Family Medicine

## 2016-10-23 DIAGNOSIS — E041 Nontoxic single thyroid nodule: Secondary | ICD-10-CM

## 2017-07-14 ENCOUNTER — Other Ambulatory Visit: Payer: Self-pay | Admitting: Family Medicine

## 2017-07-14 DIAGNOSIS — R0602 Shortness of breath: Secondary | ICD-10-CM

## 2017-07-14 DIAGNOSIS — Z853 Personal history of malignant neoplasm of breast: Secondary | ICD-10-CM

## 2017-07-14 DIAGNOSIS — R110 Nausea: Secondary | ICD-10-CM

## 2017-07-14 DIAGNOSIS — R5383 Other fatigue: Secondary | ICD-10-CM

## 2017-07-21 ENCOUNTER — Ambulatory Visit
Admission: RE | Admit: 2017-07-21 | Discharge: 2017-07-21 | Disposition: A | Discharge status: Home / Self Care | Payer: BLUE CROSS/BLUE SHIELD | Source: Ambulatory Visit | Attending: Family Medicine | Admitting: Family Medicine

## 2017-07-21 DIAGNOSIS — R0602 Shortness of breath: Secondary | ICD-10-CM

## 2017-07-21 DIAGNOSIS — Z853 Personal history of malignant neoplasm of breast: Secondary | ICD-10-CM

## 2017-07-21 DIAGNOSIS — R110 Nausea: Secondary | ICD-10-CM

## 2017-07-21 DIAGNOSIS — R5383 Other fatigue: Secondary | ICD-10-CM

## 2017-07-21 MED ORDER — IOPAMIDOL (ISOVUE-300) INJECTION 61%
100.0000 mL | Freq: Once | INTRAVENOUS | Status: AC | PRN
  Administered 2017-07-21: 100 mL via INTRAVENOUS

## 2018-08-29 IMAGING — US US THYROID
1 series · 13 of 25 positions shown · non-contrast
Comparison: 09/18/2015, 09/02/2015

CLINICAL DATA: Right thyroid nodule previous right thyroid nodule
biopsy 09/18/2015

EXAM:
THYROID ULTRASOUND
TECHNIQUE: Ultrasound examination of the thyroid gland and adjacent soft
tissues was performed.

[Series 1: us thyroid · 0.04mm/px · 13 of 55 slices shown]
[im 1/55]
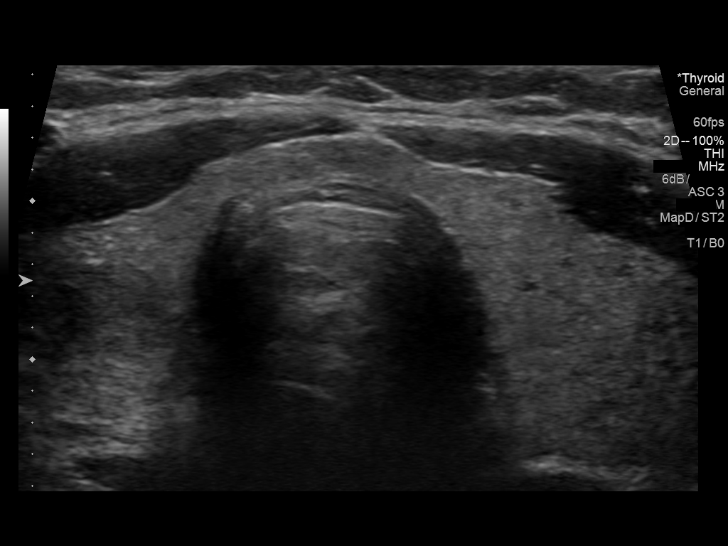
[im 5/55]
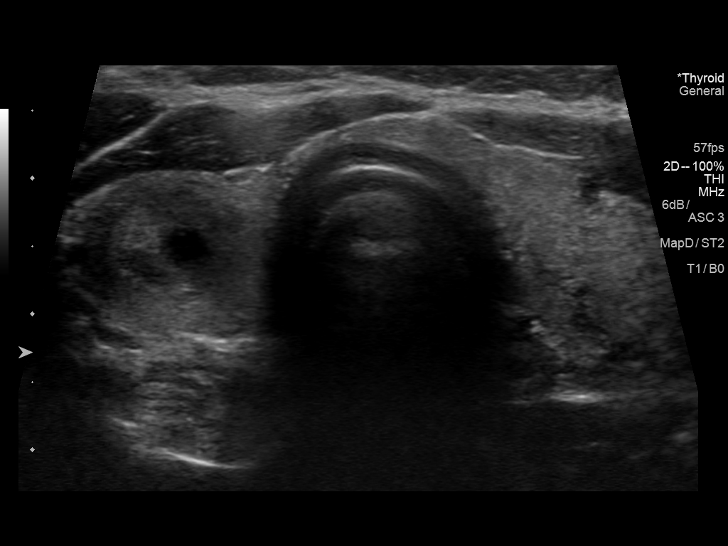
[im 10/55]
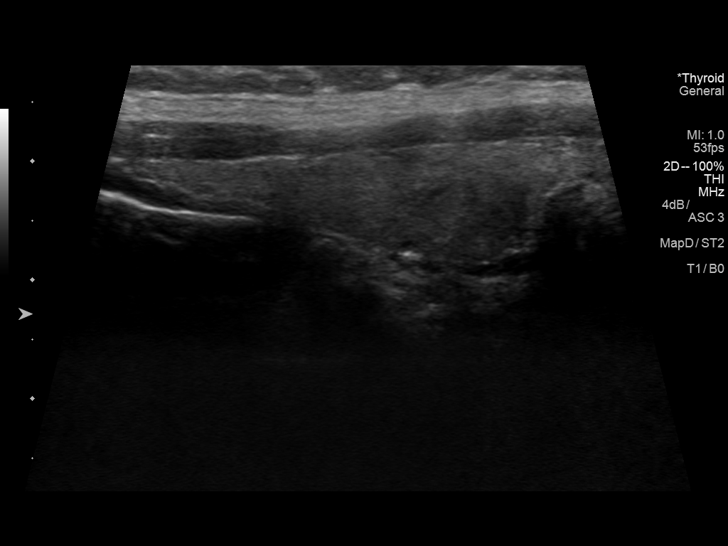
[im 14/55]
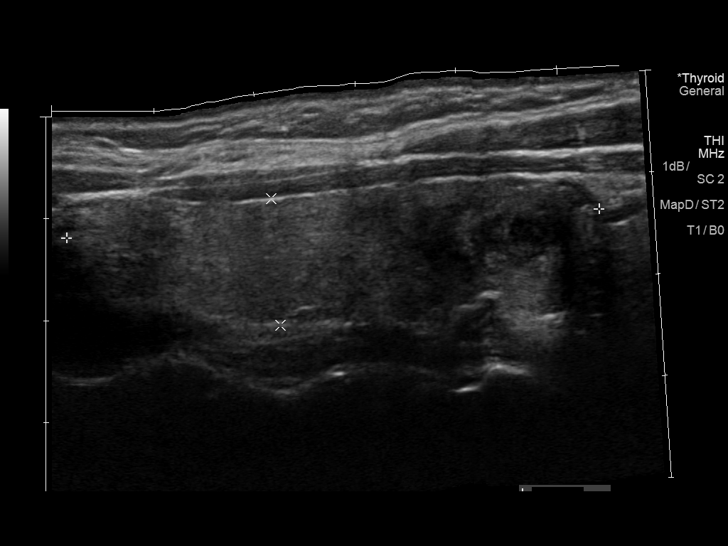
[im 19/55]
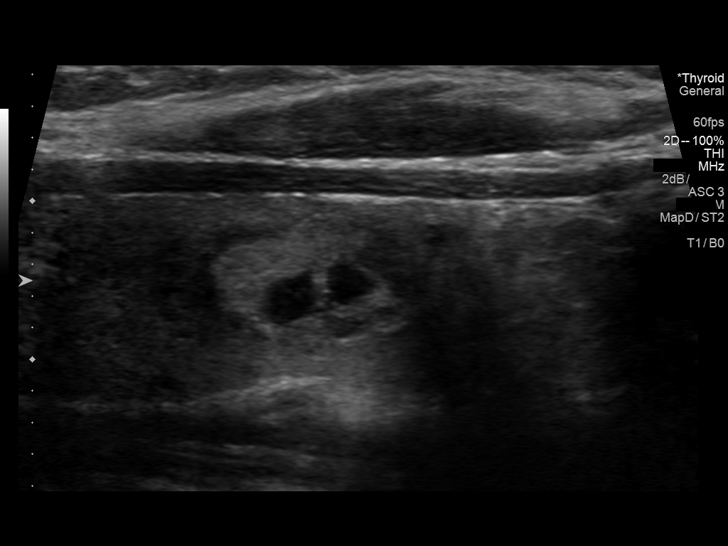
[im 23/55]
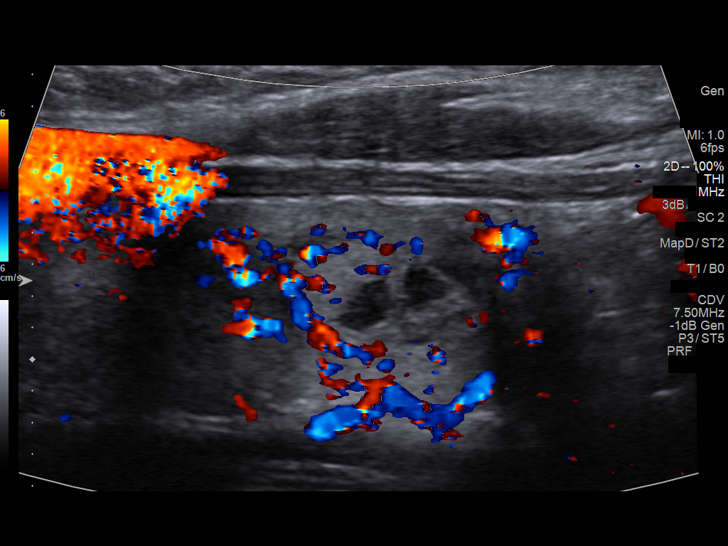
[im 28/55]
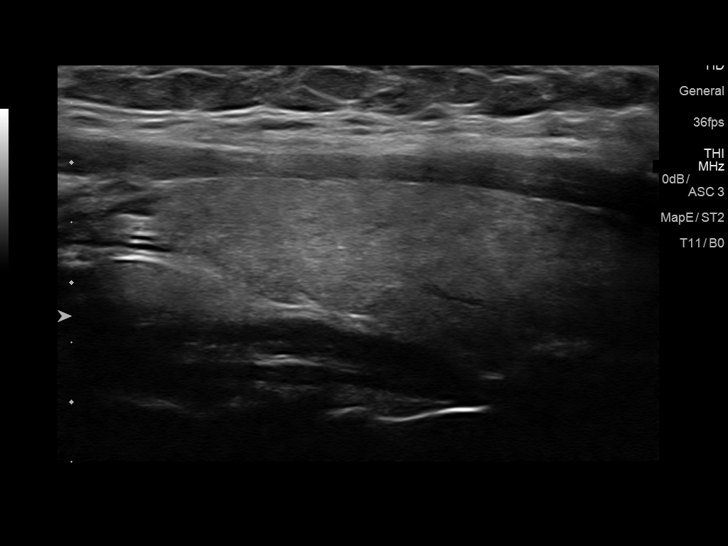
[im 32/55]
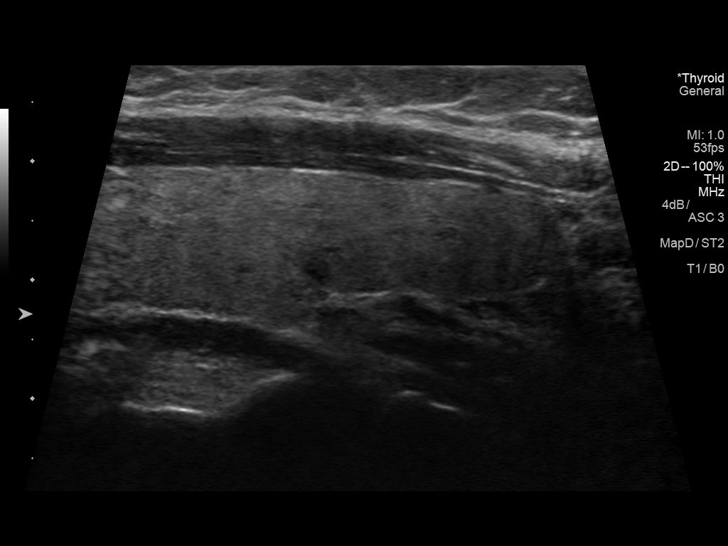
[im 37/55]
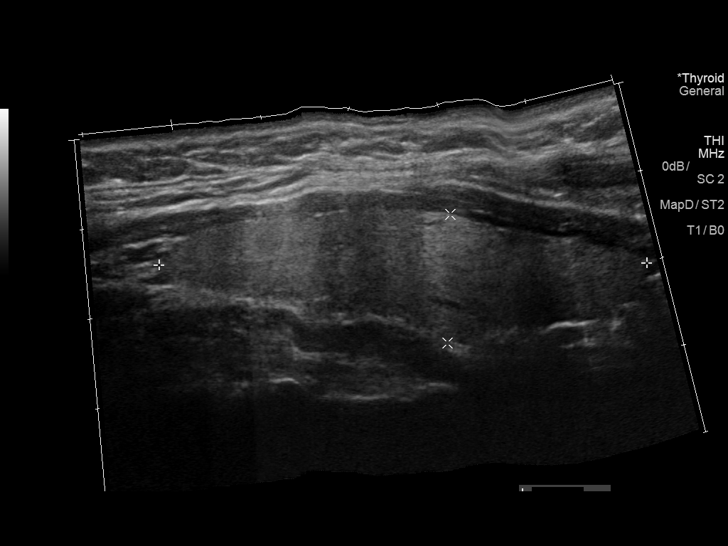
[im 41/55]
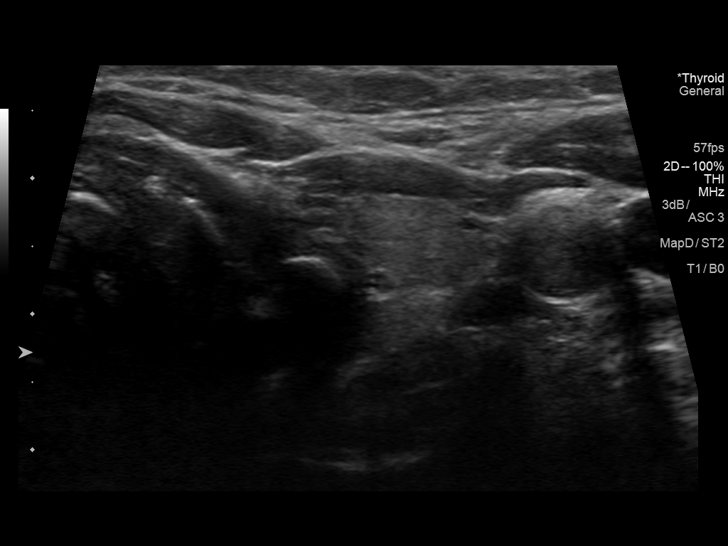
[im 46/55]
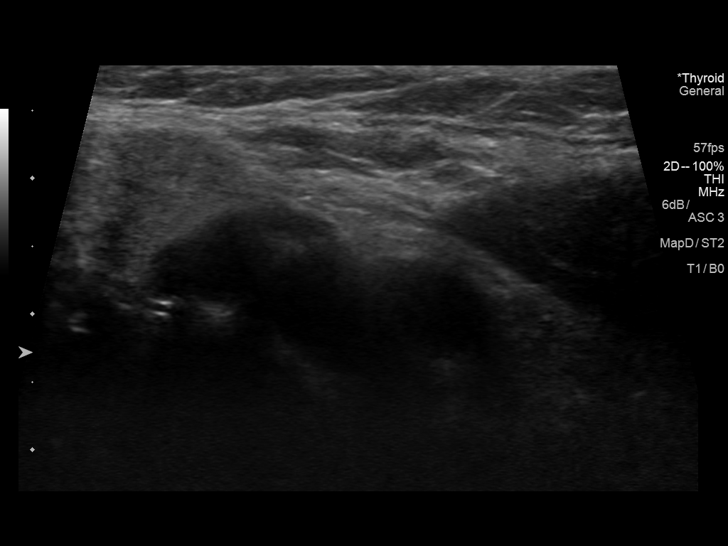
[im 50/55]
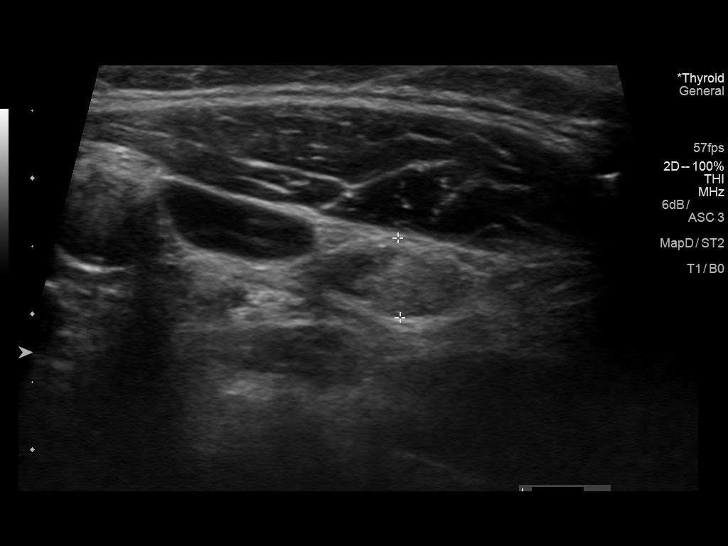
[im 55/55]
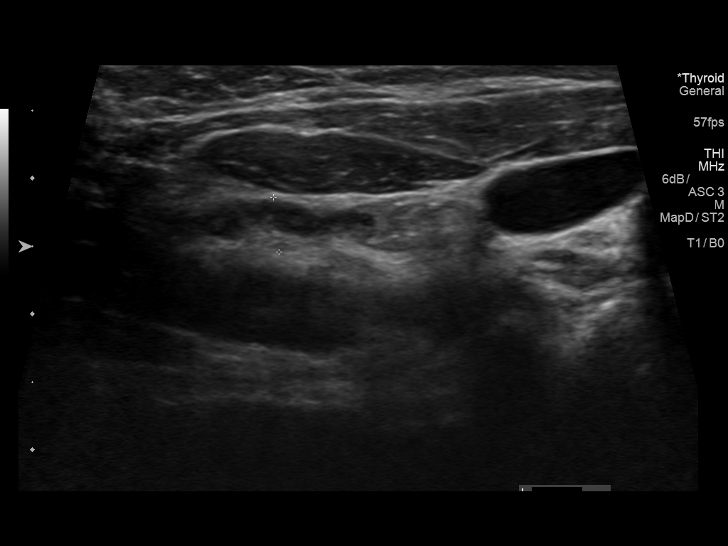

[13 of 25 positions shown; findings below may reference images not displayed]

FINDINGS: Parenchymal Echotexture: Mildly heterogenous

Isthmus: 0.3 mm, unchanged

Right lobe: 5.2 x 1.2 x 1.7 cm, previously 5.3 x 1.4 x 2.1 cm

Left lobe: 5.4 x 1.4 x 2.4 cm, previously 5.3 x 1.4 x

_________________________________________________________

Estimated total number of nodules >/= 1 cm: 0

Number of spongiform nodules >/=  2 cm not described below (TR1): 0

Number of mixed cystic and solid nodules >/= 1.5 cm not described
below (TR2): 0

_________________________________________________________

Nodule # 1:

Location: Right; Inferior

Maximum size: 1.3, previously 2.0 cm; Other 2 dimensions: 0.8 x 0.9,
previously 1.1 x 1.3 cm

Composition: mixed cystic and solid (1)

Echogenicity: isoechoic (1)

Shape: not taller-than-wide (0)

Margins: ill-defined (0)

Echogenic foci: none (0)

ACR TI-RADS total points: 2.

ACR TI-RADS risk category: TR2 (2 points).

ACR TI-RADS recommendations:

This nodule does NOT meet TI-RADS criteria for biopsy or dedicated
follow-up.

_________________________________________________________
IMPRESSION: 1.3 cm isoechoic mixed cystic/solid right thyroid nodule (TR 2
nodule). This nodule does not meet criteria for follow-up or
additional biopsy. Please note nodule was previously biopsied
09/18/2015.

The above is in keeping with the ACR TI-RADS recommendations - [HOSPITAL] 6155;[DATE].

## 2019-06-06 IMAGING — CT CT ABD-PELV W/ CM
3 of 5 series · 7 of 46 positions shown, 13 images · IV contrast (APPLIED)
Comparison: None.

CLINICAL DATA: Nausea and shortness of breath in a patient with a
history of breast cancer.

EXAM:
CT CHEST, ABDOMEN, AND PELVIS WITH CONTRAST
TECHNIQUE: Multidetector CT imaging of the chest, abdomen and pelvis was
performed following the standard protocol during bolus
administration of intravenous contrast.
CONTRAST:  100 cc [id number]

[Series 3: cor · coronal · 0.71mm/px · 3 of 93 slices shown, 4 images]
[im 31/93  soft-tissue]
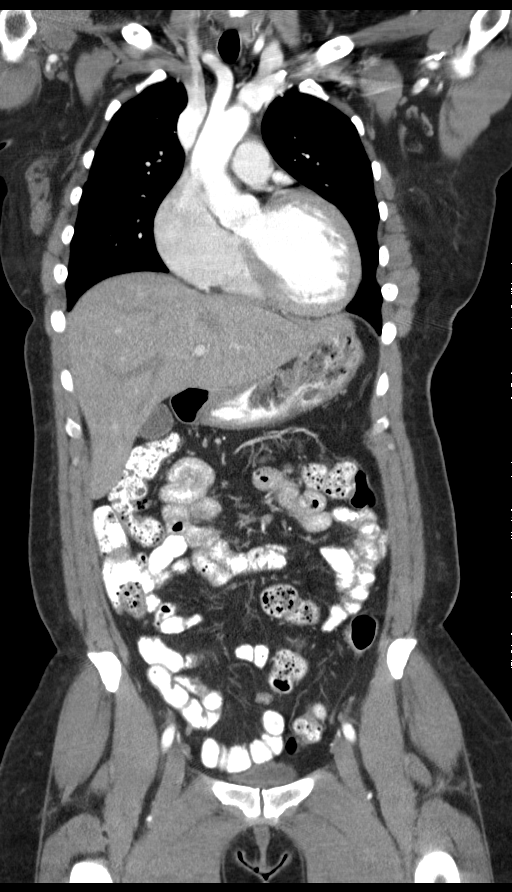
[im 41/93  soft-tissue]
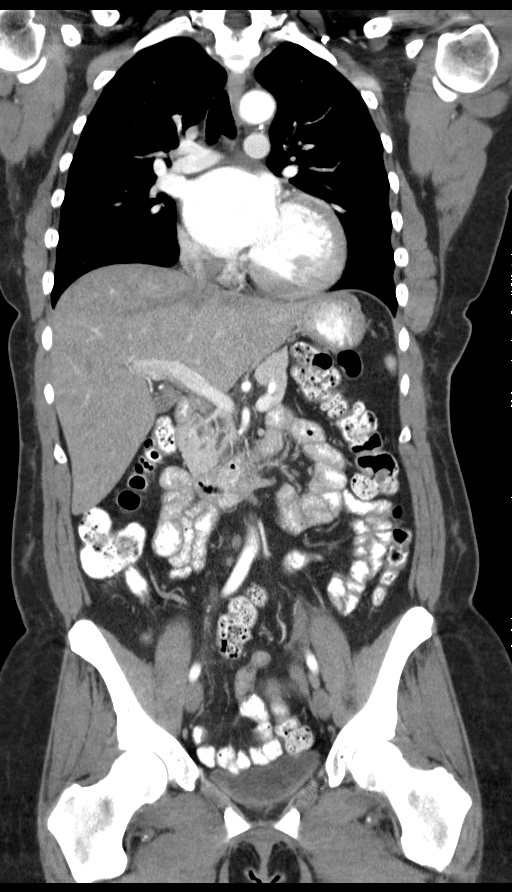
[im 41/93  bone]
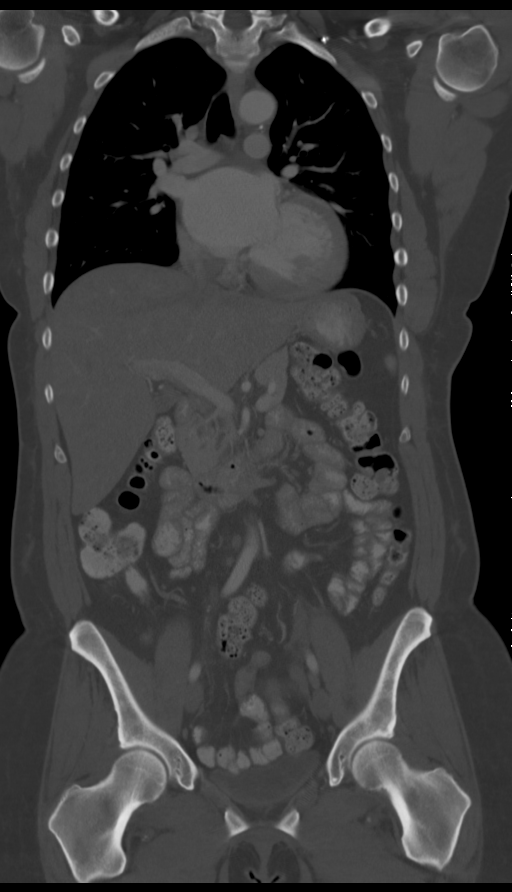
[im 52/93  soft-tissue]
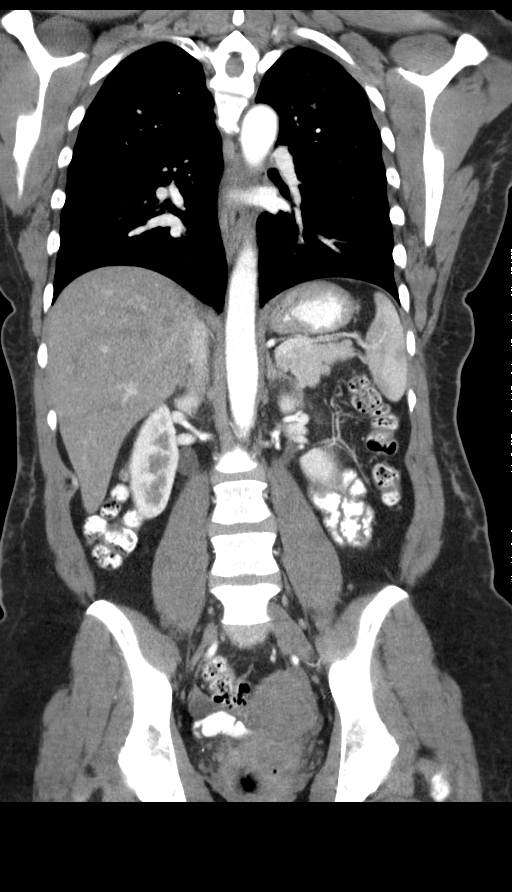

[Series 4: sag · sagittal · 0.57mm/px · 1 of 107 slices shown, 2 images]
[im 36/107  soft-tissue]
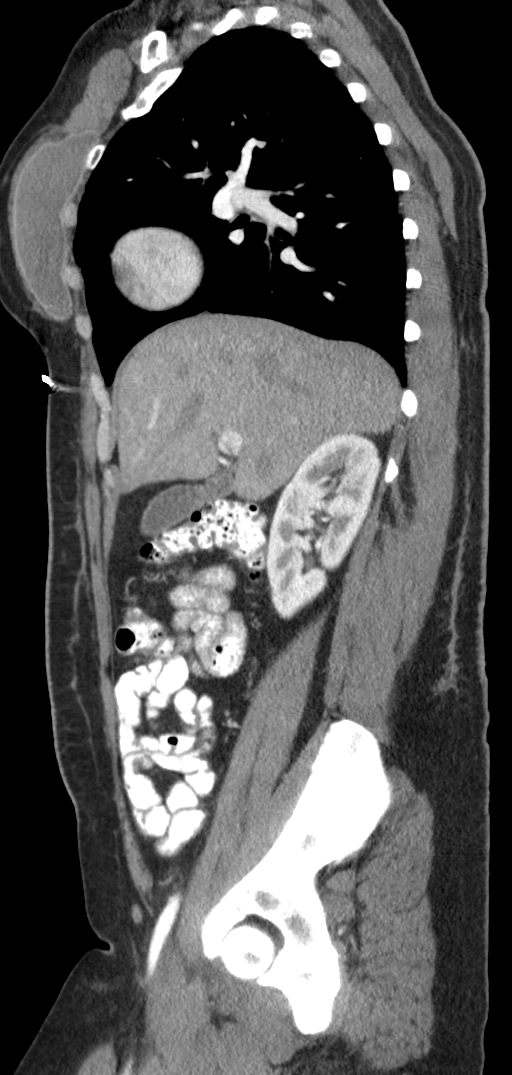
[im 36/107  bone]
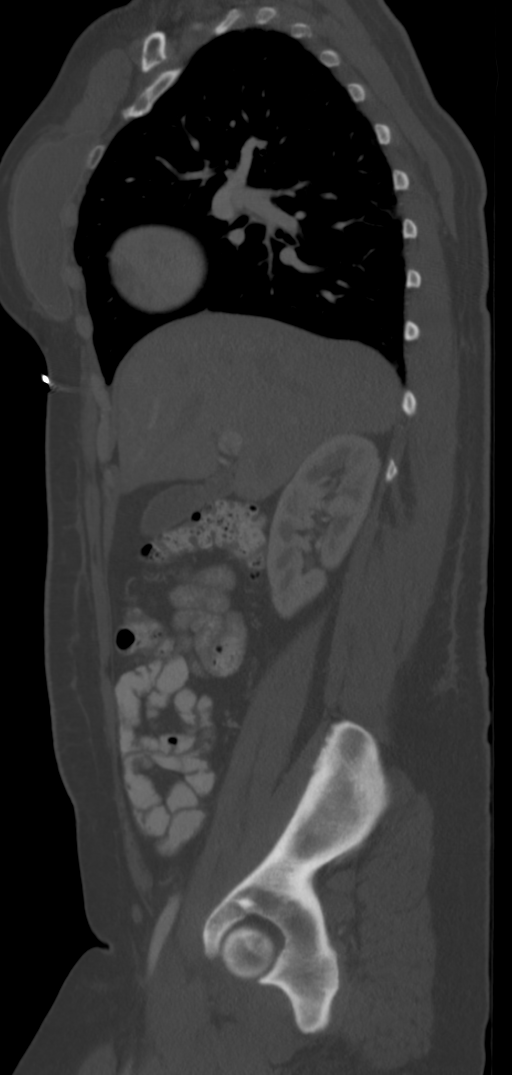

[Series 7: renal delay · axial · delayed · 0.71mm/px · z∈[+834,+1004]mm · 3 of 35 slices shown, 7 images]
[im 1/35  soft-tissue]
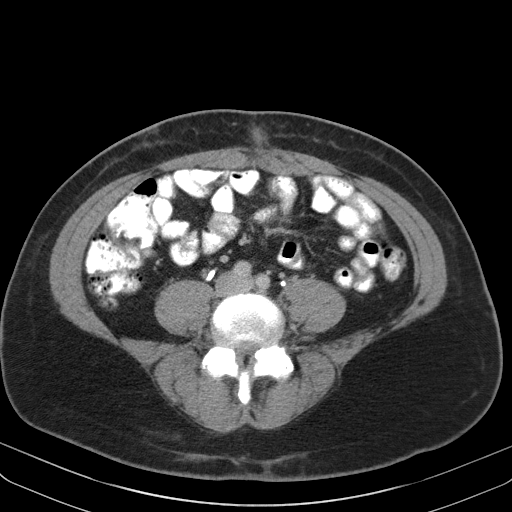
[im 1/35  lung]
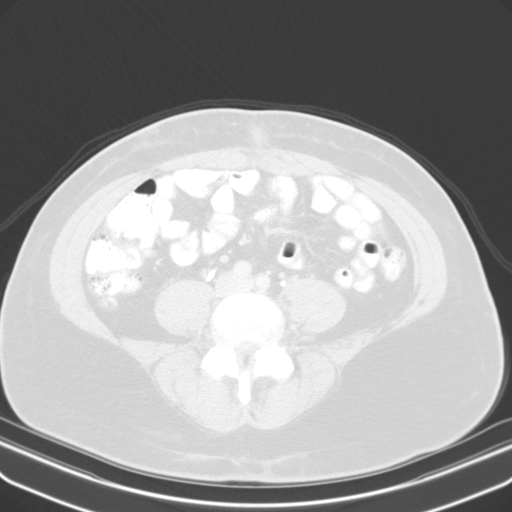
[im 1/35  bone]
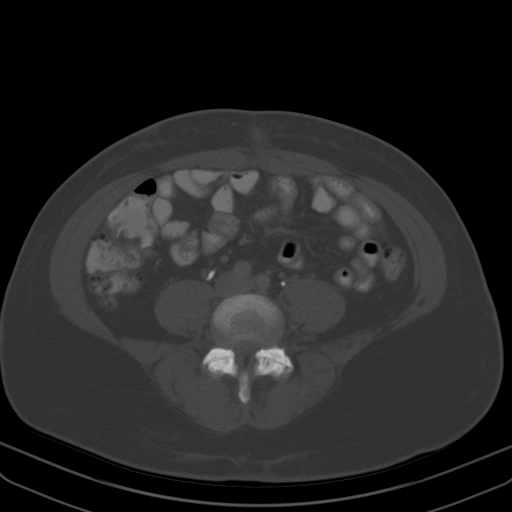
[im 18/35  soft-tissue]
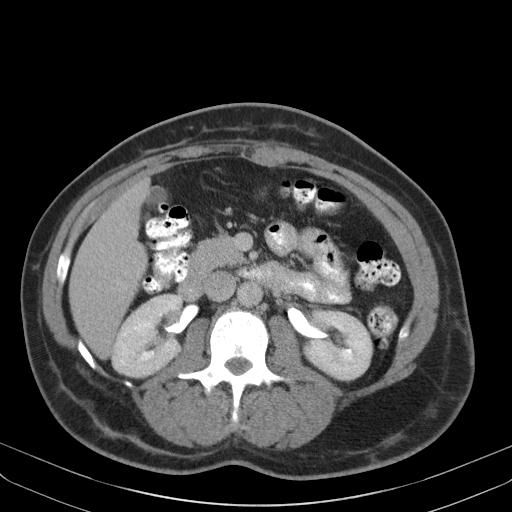
[im 18/35  lung]
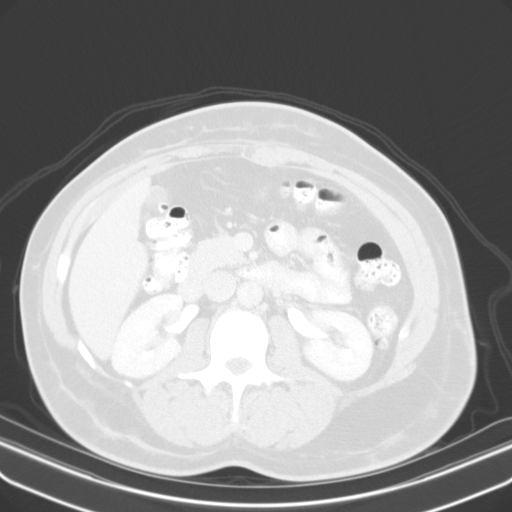
[im 35/35  soft-tissue]
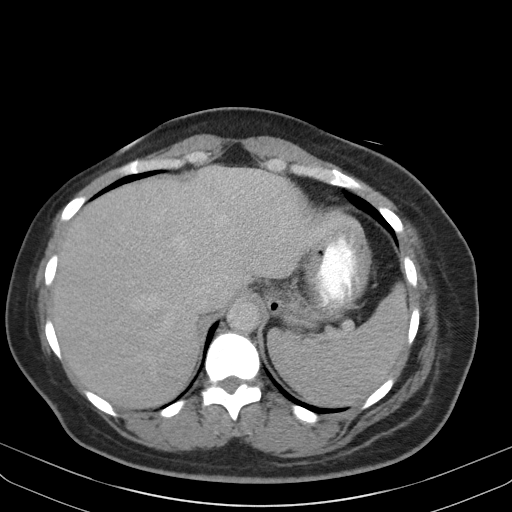
[im 35/35  lung]
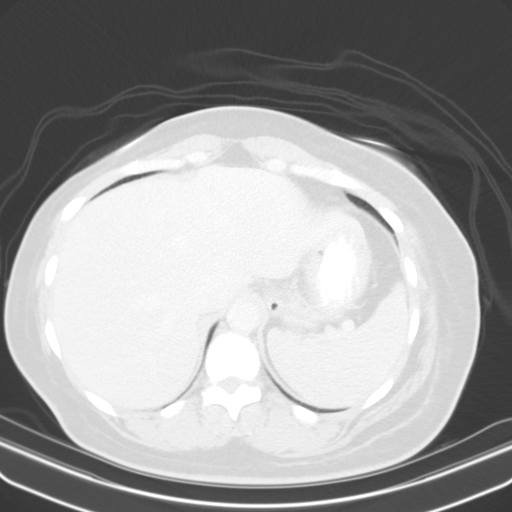

[7 of 46 positions shown; findings below may reference images not displayed]

FINDINGS: CT CHEST FINDINGS

Cardiovascular: Heart is enlarged. No pericardial effusion. No
thoracic aortic aneurysm.

Mediastinum/Nodes: No mediastinal lymphadenopathy. There is no hilar
lymphadenopathy. There is no axillary lymphadenopathy. The esophagus
has normal imaging features. 7 mm right thyroid nodule noted.

Lungs/Pleura: Lungs are clear without focal airspace consolidation
or pulmonary edema. No pleural effusion. No suspicious pulmonary
nodule or mass.

Musculoskeletal: Bone windows reveal no worrisome lytic or sclerotic
osseous lesions. Right mastectomy with reconstruction.

CT ABDOMEN PELVIS FINDINGS

Hepatobiliary: No focal abnormality within the liver parenchyma.
There is no evidence for gallstones, gallbladder wall thickening, or
pericholecystic fluid. No intrahepatic or extrahepatic biliary
dilation.

Pancreas: No focal mass lesion. No dilatation of the main duct. No
intraparenchymal cyst. No peripancreatic edema.

Spleen: No splenomegaly. No focal mass lesion.

Adrenals/Urinary Tract: No adrenal nodule or mass. Kidneys are
unremarkable. No evidence for hydroureter. The urinary bladder
appears normal for the degree of distention.

Stomach/Bowel: Stomach is nondistended. No gastric wall thickening.
No evidence of outlet obstruction. Duodenum is normally positioned
as is the ligament of Treitz. No small bowel wall thickening. No
small bowel dilatation. The terminal ileum is normal. The appendix
is normal. Stomach is nondistended. No gastric wall thickening. No
evidence of outlet obstruction. Diverticular changes are noted in
the left colon without evidence of diverticulitis.

Vascular/Lymphatic: No abdominal aortic aneurysm. There is no
gastrohepatic or hepatoduodenal ligament lymphadenopathy. No
intraperitoneal or retroperitoneal lymphadenopathy. No pelvic
sidewall lymphadenopathy.

Reproductive: The uterus has normal CT imaging appearance. There is
no adnexal mass.

Other: No intraperitoneal free fluid.

Musculoskeletal: Bone windows reveal no worrisome lytic or sclerotic
osseous lesions.
IMPRESSION: 1. No acute findings in the chest, abdomen, or pelvis. No evidence
for metastatic disease on today's exam.
2. No findings to explain the patient's history of nausea with
shortness of breath.
